# Patient Record
Sex: Female | Born: 1975 | Race: White | Hispanic: No | Marital: Married | Smoking: Never smoker
Health system: Southern US, Community
[De-identification: ages and names within clinical notes are randomized; demographics above are authoritative.]

## PROBLEM LIST (undated history)

## (undated) DIAGNOSIS — K429 Umbilical hernia without obstruction or gangrene: Secondary | ICD-10-CM

## (undated) DIAGNOSIS — F32A Depression, unspecified: Secondary | ICD-10-CM

## (undated) DIAGNOSIS — D649 Anemia, unspecified: Secondary | ICD-10-CM

## (undated) HISTORY — DX: Depression, unspecified: F32.A

## (undated) HISTORY — DX: Umbilical hernia without obstruction or gangrene: K42.9

## (undated) HISTORY — PX: AUGMENTATION MAMMAPLASTY: SUR837

## (undated) HISTORY — DX: Anemia, unspecified: D64.9

---

## 2019-02-03 LAB — COMPREHENSIVE METABOLIC PANEL
Albumin: 5 (ref 3.5–5.0)
Calcium: 9.5 (ref 8.7–10.7)
GFR calc non Af Amer: 108
Globulin: 2.4

## 2019-02-03 LAB — BASIC METABOLIC PANEL
BUN: 10 (ref 4–21)
CO2: 25 — AB (ref 13–22)
Chloride: 105 (ref 99–108)
Creatinine: 0.7 (ref 0.5–1.1)
Glucose: 94
Potassium: 4.4 (ref 3.4–5.3)
Sodium: 142 (ref 137–147)

## 2019-02-03 LAB — HEPATIC FUNCTION PANEL
ALT: 12 (ref 7–35)
AST: 17 (ref 13–35)
Alkaline Phosphatase: 39 (ref 25–125)
Bilirubin, Total: 0.6

## 2019-02-03 LAB — CBC: RBC: 4.42 (ref 3.87–5.11)

## 2019-02-03 LAB — CBC AND DIFFERENTIAL
HCT: 39 (ref 36–46)
Hemoglobin: 13.2 (ref 12.0–16.0)
Platelets: 150 (ref 150–399)
WBC: 6.8

## 2019-11-21 ENCOUNTER — Telehealth: Payer: Self-pay

## 2019-11-25 ENCOUNTER — Ambulatory Visit
Admission: RE | Admit: 2019-11-25 | Discharge: 2019-11-25 | Disposition: A | Discharge status: Home / Self Care | Payer: Self-pay | Source: Ambulatory Visit | Attending: Oncology | Admitting: Oncology

## 2019-11-25 ENCOUNTER — Other Ambulatory Visit: Payer: Self-pay | Admitting: Oncology

## 2019-11-25 ENCOUNTER — Other Ambulatory Visit: Payer: Self-pay

## 2019-11-25 ENCOUNTER — Ambulatory Visit: Payer: Self-pay | Attending: Oncology | Admitting: *Deleted

## 2019-11-25 ENCOUNTER — Encounter: Payer: Self-pay | Admitting: *Deleted

## 2019-11-25 DIAGNOSIS — Z0000 Encounter for general adult medical examination without abnormal findings: Secondary | ICD-10-CM

## 2019-11-25 DIAGNOSIS — N63 Unspecified lump in unspecified breast: Secondary | ICD-10-CM

## 2019-11-25 NOTE — Progress Notes (Addendum)
  Subjective:     Patient ID: Debra Hancock, female   DOB: [birth year], 43 y.o.   MRN: [id number]  HPI   Review of Systems     Objective:   Physical Exam     Assessment:     Due to Covid 19 pandemic a televisit was used to enroll patient into our BCCCP program.  Verbal consent given using 2 patient identifiers.  Patient has been screened for eligibility.  She does not have any insurance, Medicare or Medicaid.  She also meets financial eligibility.  Denies any breast problems at this time.  States her last pap was in 2018, in [place] was normal.  She has never had a mammogram.  Next pap due in 2021. See [facility] breast cancer risk assessment below. Risk Assessment    Risk Scores      11/25/2019   Last edited by: Rico Junker, RN   5-year risk: 1.2 %   Lifetime risk: 18.6 %             Plan:     Screening mammogram ordered.  Will follow up per BCCCP protocol.

## 2019-12-03 ENCOUNTER — Encounter: Payer: Self-pay | Admitting: *Deleted

## 2019-12-03 NOTE — Progress Notes (Signed)
Called patient today and reviewed her mammogram results and need for additional imaging.  Transferred her call to the [facility] to [facility] for scheduling.  She is to call with any further questions or needs.

## 2019-12-10 ENCOUNTER — Ambulatory Visit
Admission: RE | Admit: 2019-12-10 | Discharge: 2019-12-10 | Disposition: A | Discharge status: Home / Self Care | Payer: Self-pay | Source: Ambulatory Visit | Attending: Oncology | Admitting: Oncology

## 2019-12-10 DIAGNOSIS — N63 Unspecified lump in unspecified breast: Secondary | ICD-10-CM | POA: Insufficient documentation

## 2019-12-11 ENCOUNTER — Encounter: Payer: Self-pay | Admitting: *Deleted

## 2019-12-11 ENCOUNTER — Other Ambulatory Visit: Payer: Self-pay | Admitting: *Deleted

## 2019-12-11 DIAGNOSIS — N63 Unspecified lump in unspecified breast: Secondary | ICD-10-CM

## 2019-12-11 NOTE — Progress Notes (Signed)
Letter mailed to inform patient of her appointment on 06/10/20 @ 9:40 for her 6 month follow up mammogram.  Will continue to follow up per BCCCP protocol.

## 2020-06-09 ENCOUNTER — Ambulatory Visit: Payer: Self-pay | Attending: Oncology

## 2020-06-10 ENCOUNTER — Other Ambulatory Visit: Payer: Self-pay

## 2020-06-10 ENCOUNTER — Inpatient Hospital Stay: Admission: RE | Admit: 2020-06-10 | Payer: Self-pay | Source: Ambulatory Visit

## 2020-07-28 ENCOUNTER — Ambulatory Visit
Admission: RE | Admit: 2020-07-28 | Discharge: 2020-07-28 | Disposition: A | Discharge status: Home / Self Care | Payer: Self-pay | Source: Ambulatory Visit | Attending: Oncology | Admitting: Oncology

## 2020-07-28 ENCOUNTER — Other Ambulatory Visit: Payer: Self-pay

## 2020-07-28 DIAGNOSIS — N63 Unspecified lump in unspecified breast: Secondary | ICD-10-CM | POA: Insufficient documentation

## 2020-11-01 ENCOUNTER — Other Ambulatory Visit: Payer: Self-pay

## 2020-11-01 ENCOUNTER — Encounter: Payer: Self-pay | Admitting: Internal Medicine

## 2020-11-01 ENCOUNTER — Ambulatory Visit (INDEPENDENT_AMBULATORY_CARE_PROVIDER_SITE_OTHER): Payer: 59 | Admitting: Internal Medicine

## 2020-11-01 VITALS — BP 116/70 | HR 86 | Temp 98.6°F | Ht 65.0 in | Wt 119.0 lb

## 2020-11-01 DIAGNOSIS — N6320 Unspecified lump in the left breast, unspecified quadrant: Secondary | ICD-10-CM | POA: Insufficient documentation

## 2020-11-01 DIAGNOSIS — K429 Umbilical hernia without obstruction or gangrene: Secondary | ICD-10-CM | POA: Diagnosis not present

## 2020-11-01 DIAGNOSIS — M50121 Cervical disc disorder at C4-C5 level with radiculopathy: Secondary | ICD-10-CM | POA: Diagnosis not present

## 2020-11-01 DIAGNOSIS — F331 Major depressive disorder, recurrent, moderate: Secondary | ICD-10-CM

## 2020-11-01 DIAGNOSIS — M5126 Other intervertebral disc displacement, lumbar region: Secondary | ICD-10-CM | POA: Diagnosis not present

## 2020-11-01 MED ORDER — SERTRALINE HCL 50 MG PO TABS: 50.0000 mg | ORAL_TABLET | Freq: Every day | ORAL | 1 refills | Status: DC

## 2020-11-01 NOTE — Progress Notes (Signed)
Date:  11/01/2020   Name:  Debra Hancock   DOB:  17-Mar-1976   MRN:  [id number]   Chief Complaint: Establish Care, Back Pain (Injury May 2019 and Left hip and back has not been right since. Has had many MRI's and Xrays since. Knee cracking when pressure/ pain builds up under buttocks. ), and Depression (Has taken medications in the past and interested in going back on medications to help - tried Zoloft in the past and said it was okay.- had no side effects. )  Depression        This is a recurrent problem.  The problem has been gradually worsening since onset.  Associated symptoms include decreased concentration, helplessness, hopelessness, decreased interest, appetite change, myalgias (left buttock and upper hamstring) and sad.  Associated symptoms include no fatigue, no headaches and no suicidal ideas.     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  Past treatments include SSRIs - Selective serotonin reuptake inhibitors (on Zoloft in the past; also tried cymbalta with no benefit for depression or pain). Back Pain This is a chronic problem. The current episode started more than 1 year ago (several years ago caused by a dead lift in competition but did not stop). The problem occurs daily. The problem is unchanged (or gradually worsening). The pain is present in the lumbar spine and gluteal. The quality of the pain is described as aching and cramping. The pain radiates to the left knee. The pain is moderate. Associated symptoms include abdominal pain (umbilical hernia painful intermittently, esp when she gains weight) and tingling (in left 5th finger). Pertinent negatives include no bladder incontinence, bowel incontinence, chest pain, fever, headaches, pelvic pain or weakness. She has tried chiropractic manipulation (PTx) for the symptoms. The treatment provided no relief.  Neck Pain  This is a chronic problem. The  current episode started more than 1 year ago (several years ago no exact injury). The problem occurs daily. The problem has been gradually worsening. The pain is present in the left side. The quality of the pain is described as aching and burning. The pain is moderate. The symptoms are aggravated by bending and twisting. Associated symptoms include tingling (in left 5th finger). Pertinent negatives include no chest pain, fever, headaches or weakness. She has tried NSAIDs, chiropractic manipulation and heat for the symptoms. The treatment provided no relief.  Abnormal left mammogram - density seen on mammogram in August.  Requested a repeat Bilat Dx mammogram in 6 months.  She denies breast mass, tenderness or pain.   No results found for: CREATININE, BUN, NA, K, CL, CO2 No results found for: CHOL, HDL, LDLCALC, LDLDIRECT, TRIG, CHOLHDL No results found for: TSH No results found for: HGBA1C No results found for: WBC, HGB, HCT, MCV, PLT No results found for: ALT, AST, GGT, ALKPHOS, BILITOT   Review of Systems  Constitutional: Positive for appetite change. Negative for diaphoresis, fatigue, fever and unexpected weight change.  Respiratory: Negative for cough, chest tightness and shortness of breath.   Cardiovascular: Negative for chest pain and leg swelling.  Gastrointestinal: Positive for abdominal pain (umbilical hernia painful intermittently, esp when she gains weight). Negative for bowel incontinence, constipation, diarrhea and nausea.  Genitourinary: Negative for bladder incontinence and pelvic pain.  Musculoskeletal: Positive for back pain, myalgias (left buttock and upper hamstring) and neck pain.  Neurological: Positive for tingling (in left 5th finger). Negative for dizziness, weakness, light-headedness  and headaches.  Psychiatric/Behavioral: Positive for decreased concentration, depression and sleep disturbance. Negative for suicidal ideas. The patient is nervous/anxious.     Patient  Active Problem List   Diagnosis Date Noted  . Breast mass, left 11/01/2020    No Known Allergies  Past Surgical History:  Procedure Laterality Date  . AUGMENTATION MAMMAPLASTY Bilateral   . CESAREAN SECTION     X 2    Social History   Tobacco Use  . Smoking status: Never Smoker  . Smokeless tobacco: Never Used  Vaping Use  . Vaping Use: Never used  Substance Use Topics  . Alcohol use: Yes    Alcohol/week: 7.0 standard drinks    Types: 7 Standard drinks or equivalent per week  . Drug use: Never     Medication list has been reviewed and updated.  No outpatient medications have been marked as taking for the 11/01/20 encounter (Office Visit) with Reubin Milan, MD.    [facility] 2/9 Scores 11/01/2020  PHQ - 2 Score 6  PHQ- 9 Score 16    GAD 7 : Generalized Anxiety Score 11/01/2020  Nervous, Anxious, on Edge 2  Control/stop worrying 3  Worry too much - different things 3  Trouble relaxing 3  Restless 2  Easily annoyed or irritable 2  Afraid - awful might happen 0  Total GAD 7 Score 15  Anxiety Difficulty Somewhat difficult    BP Readings from Last 3 Encounters:  11/01/20 116/70    Physical Exam Vitals and nursing note reviewed.  Constitutional:      General: She is not in acute distress.    Appearance: Normal appearance. She is well-developed.  HENT:     Head: Normocephalic and atraumatic.  Cardiovascular:     Rate and Rhythm: Normal rate and regular rhythm.     Pulses: Normal pulses.     Heart sounds: No murmur heard.   Pulmonary:     Effort: Pulmonary effort is normal. No respiratory distress.     Breath sounds: No wheezing or rhonchi.  Abdominal:     General: Abdomen is flat.     Palpations: Abdomen is soft.     Tenderness: There is abdominal tenderness (at umbilical hernia).     Hernia: A hernia (reducible, mildly tender) is present.  Musculoskeletal:     Cervical back: Spasms and tenderness present. Decreased range of motion.     Lumbar back:  Bony tenderness present. No tenderness. Normal range of motion. Negative right straight leg raise test and negative left straight leg raise test. No scoliosis.     Right lower leg: No edema.     Left lower leg: No edema.  Skin:    General: Skin is warm and dry.     Capillary Refill: Capillary refill takes less than 2 seconds.     Findings: No rash.  Neurological:     General: No focal deficit present.     Mental Status: She is alert and oriented to person, place, and time.     Cranial Nerves: Cranial nerves are intact.     Sensory: Sensation is intact.     Motor: Motor function is intact. No weakness.     Coordination: Coordination is intact.     Gait: Gait is intact. Gait normal.     Deep Tendon Reflexes:     Reflex Scores:      Bicep reflexes are 2+ on the right side and 2+ on the left side.      Patellar  reflexes are 3+ on the right side and 3+ on the left side. Psychiatric:        Mood and Affect: Mood normal.     Wt Readings from Last 3 Encounters:  11/01/20 119 lb (54 kg)    BP 116/70   Pulse 86   Temp 98.6 F (37 C) (Oral)   Ht 5\' 5"  (1.651 m)   Wt 119 lb (54 kg)   LMP 10/25/2020 (Exact Date)   SpO2 97%   BMI 19.80 kg/m   Assessment and Plan: 1. Moderate episode of recurrent major depressive disorder (HCC) Begin Zoloft 50 mg daily Follow up in 6 weeks for dose titration if needed Call if side effects or other medication issues - sertraline (ZOLOFT) 50 MG tablet; Take 1 tablet (50 mg total) by mouth daily.  Dispense: 30 tablet; Refill: 1  2. Umbilical hernia without obstruction and without gangrene Pt is reassured - this may need to be addressed with surgery but for now we can continue to monitor.  3. HNP (herniated nucleus pulposus), lumbar Per patient report she has had several MRIs and xrays Did not improve with PTx or Chiropractic manipulation The only medication she has tried is Cymbalta and Advil - no muscle relaxants, gabapentin or ESI treatment Will  wait for prior imaging and the discuss options  4. Cervical disc disorder at C4-C5 level with radiculopathy Has tingling in her left 5th finger intermittently Would probably benefit from muscle relaxants but pt is hesitant to take medication daily Will discuss again at follow up visit.   Partially dictated using 10/27/2020. Any errors are unintentional.  Animal nutritionist, MD [facility]  11/01/2020

## 2020-12-15 ENCOUNTER — Ambulatory Visit: Payer: 59 | Admitting: Internal Medicine

## 2021-01-03 ENCOUNTER — Other Ambulatory Visit: Payer: Self-pay

## 2021-01-03 ENCOUNTER — Encounter: Payer: Self-pay | Admitting: Internal Medicine

## 2021-01-03 ENCOUNTER — Ambulatory Visit (INDEPENDENT_AMBULATORY_CARE_PROVIDER_SITE_OTHER): Payer: 59 | Admitting: Internal Medicine

## 2021-01-03 VITALS — BP 108/62 | HR 93 | Temp 97.9°F | Ht 65.0 in | Wt 125.0 lb

## 2021-01-03 DIAGNOSIS — F331 Major depressive disorder, recurrent, moderate: Secondary | ICD-10-CM | POA: Diagnosis not present

## 2021-01-03 DIAGNOSIS — N6320 Unspecified lump in the left breast, unspecified quadrant: Secondary | ICD-10-CM

## 2021-01-03 DIAGNOSIS — M50121 Cervical disc disorder at C4-C5 level with radiculopathy: Secondary | ICD-10-CM

## 2021-01-03 DIAGNOSIS — M5126 Other intervertebral disc displacement, lumbar region: Secondary | ICD-10-CM | POA: Diagnosis not present

## 2021-01-03 MED ORDER — SERTRALINE HCL 50 MG PO TABS: 50.0000 mg | ORAL_TABLET | Freq: Every day | ORAL | 1 refills | Status: AC

## 2021-01-03 NOTE — Progress Notes (Signed)
Date:  01/03/2021   Name:  Debra Hancock   DOB:  [birth year]   MRN:  [id number]   Chief Complaint: Depression  Depression        This is a recurrent problem.  The current episode started more than 1 month ago.   The problem has been gradually improving since onset.  Past treatments include SSRIs - Selective serotonin reuptake inhibitors. Neck Pain  This is a chronic problem. The pain is associated with lifting a heavy object. The pain is present in the left side. The quality of the pain is described as aching. The pain is mild. Pertinent negatives include no weakness. Tingling: in left fingers. She has tried NSAIDs for the symptoms. The treatment provided no relief.  Back Pain This is a chronic problem. The pain is present in the lumbar spine. The quality of the pain is described as aching. The pain radiates to the left thigh. Pertinent negatives include no bladder incontinence, bowel incontinence, perianal numbness or weakness. Tingling: in left fingers. She has tried NSAIDs for the symptoms.   Abnormal mammogram - mammo in August recommended bilateral Dx in 5 months.  She has been doing it thru the cancer center at [facility].  Last MRI Cervical spine 04/16/07 - MRI 2007/04/16 - minimal disc bulge at C4-5, C5-6 and C6--7 No foraminal stenosis  Last MRI Lumbar 04/16/19 -  Started during a dead lift competition around 04/15/18 in [place] MRI Apr 16, 2019 - very mild disc bulge at L4-5 and L5-S1 with minimal foraminal stenosis   Lab Results  Component Value Date   CREATININE 0.7 02/03/2019   BUN 10 02/03/2019   NA 142 02/03/2019   K 4.4 02/03/2019   CL 105 02/03/2019   CO2 25 (A) 02/03/2019   No results found for: CHOL, HDL, LDLCALC, LDLDIRECT, TRIG, CHOLHDL No results found for: TSH No results found for: HGBA1C Lab Results  Component Value Date   WBC 6.8 02/03/2019   HGB 13.2 02/03/2019   HCT 39 02/03/2019   PLT 150 02/03/2019   Lab Results  Component Value Date   ALT 12 02/03/2019   AST 17 02/03/2019    ALKPHOS 39 02/03/2019     Review of Systems  Gastrointestinal: Negative for bowel incontinence.  Genitourinary: Negative for bladder incontinence.  Musculoskeletal: Positive for back pain and neck pain.  Neurological: Negative for weakness. Tingling: in left fingers.  Psychiatric/Behavioral: Positive for depression.    Patient Active Problem List   Diagnosis Date Noted  . Breast mass, left 11/01/2020  . Moderate episode of recurrent major depressive disorder (HCC) 11/01/2020  . HNP (herniated nucleus pulposus), lumbar 11/01/2020  . Cervical disc disorder at C4-C5 level with radiculopathy 11/01/2020  . Umbilical hernia without obstruction and without gangrene 11/01/2020    No Known Allergies  Past Surgical History:  Procedure Laterality Date  . AUGMENTATION MAMMAPLASTY Bilateral   . CESAREAN SECTION     X 2    Social History   Tobacco Use  . Smoking status: Never Smoker  . Smokeless tobacco: Never Used  Vaping Use  . Vaping Use: Never used  Substance Use Topics  . Alcohol use: Yes    Alcohol/week: 7.0 standard drinks    Types: 7 Standard drinks or equivalent per week  . Drug use: Never     Medication list has been reviewed and updated.  Current Meds  Medication Sig  . sertraline (ZOLOFT) 50 MG tablet Take 1 tablet (50 mg total) by mouth daily.  PHQ 2/9 Scores 01/03/2021 11/01/2020  PHQ - 2 Score 0 6  PHQ- 9 Score 0 16    GAD 7 : Generalized Anxiety Score 01/03/2021 11/01/2020  Nervous, Anxious, on Edge 0 2  Control/stop worrying 0 3  Worry too much - different things 0 3  Trouble relaxing 0 3  Restless 0 2  Easily annoyed or irritable 0 2  Afraid - awful might happen 0 0  Total GAD 7 Score 0 15  Anxiety Difficulty - Somewhat difficult    BP Readings from Last 3 Encounters:  01/03/21 108/62  11/01/20 116/70    Physical Exam Vitals and nursing note reviewed.  Constitutional:      General: She is not in acute distress.    Appearance: Normal  appearance. She is well-developed.  HENT:     Head: Normocephalic and atraumatic.  Neck:     Vascular: No carotid bruit.  Cardiovascular:     Rate and Rhythm: Normal rate and regular rhythm.     Pulses: Normal pulses.     Heart sounds: No murmur heard.   Pulmonary:     Effort: Pulmonary effort is normal. No respiratory distress.     Breath sounds: No wheezing or rhonchi.  Musculoskeletal:     Cervical back: Normal range of motion. No spasms, tenderness or bony tenderness. Normal range of motion.     Lumbar back: Bony tenderness present. Negative right straight leg raise test and negative left straight leg raise test.     Right lower leg: No edema.     Left lower leg: No edema.  Lymphadenopathy:     Cervical: No cervical adenopathy.  Skin:    General: Skin is warm and dry.     Findings: No rash.  Neurological:     Mental Status: She is alert and oriented to person, place, and time.     Sensory: Sensation is intact.     Motor: No weakness, tremor or atrophy.     Deep Tendon Reflexes:     Reflex Scores:      Bicep reflexes are 2+ on the right side and 2+ on the left side.      Brachioradialis reflexes are 2+ on the right side and 2+ on the left side.      Patellar reflexes are 2+ on the right side and 2+ on the left side. Psychiatric:        Mood and Affect: Mood and affect normal.        Behavior: Behavior normal.        Thought Content: Thought content normal.     Wt Readings from Last 3 Encounters:  01/03/21 125 lb (56.7 kg)  11/01/20 119 lb (54 kg)    BP 108/62   Pulse 93   Temp 97.9 F (36.6 C) (Oral)   Ht 5\' 5"  (1.651 m)   Wt 125 lb (56.7 kg)   LMP 12/19/2020   SpO2 99%   BMI 20.80 kg/m   Assessment and Plan: 1. Moderate episode of recurrent major depressive disorder (HCC) Clinically stable on current regimen with good control of symptoms, No SI or HI. Will continue current therapy. - sertraline (ZOLOFT) 50 MG tablet; Take 1 tablet (50 mg total) by mouth  daily.  Dispense: 90 tablet; Refill: 1  2. Breast mass, left Due for 6 month follow up - will call 12/21/2020 at [place] to schedule  3. Cervical disc disorder at C4-C5 level with radiculopathy Last MRI minimal findings in 2008 -  now with more persistent left sided sx - Ambulatory referral to Orthopedic Surgery  4. HNP (herniated nucleus pulposus), lumbar Last MRI 2020 with minimal disc bulge and foraminal stenosis - Ambulatory referral to Orthopedic Surgery   Partially dictated using Dragon software. Any errors are unintentional.  Bari Edward, MD [facility]  01/03/2021

## 2021-07-20 ENCOUNTER — Encounter: Payer: 59 | Admitting: Internal Medicine

## 2021-11-01 ENCOUNTER — Other Ambulatory Visit: Payer: Self-pay | Admitting: Internal Medicine

## 2021-11-01 DIAGNOSIS — F331 Major depressive disorder, recurrent, moderate: Secondary | ICD-10-CM

## 2021-11-02 NOTE — Telephone Encounter (Signed)
Requested medications are due for refill today.  yes  Requested medications are on the active medications list.  yes  Last refill. 01/03/2021  Future visit scheduled.   no  Notes to clinic.  Pt is more than 3 months overdue for an office visit.

## 2021-11-18 IMAGING — MG MM DIGITAL DIAGNOSTIC UNILAT*R* W/ TOMO W/ CAD
6 series · 6 of 14 positions shown · non-contrast
Comparison: 12/10/2019

CLINICAL DATA: First six-month follow-up for probably benign mass
in the LEFT breast.

EXAM:
DIGITAL DIAGNOSTIC LEFT MAMMOGRAM WITH IMPLANTS AND CAD
The patient has retropectoral saline implant. Standard and implant
displaced views were performed.

[L CC]
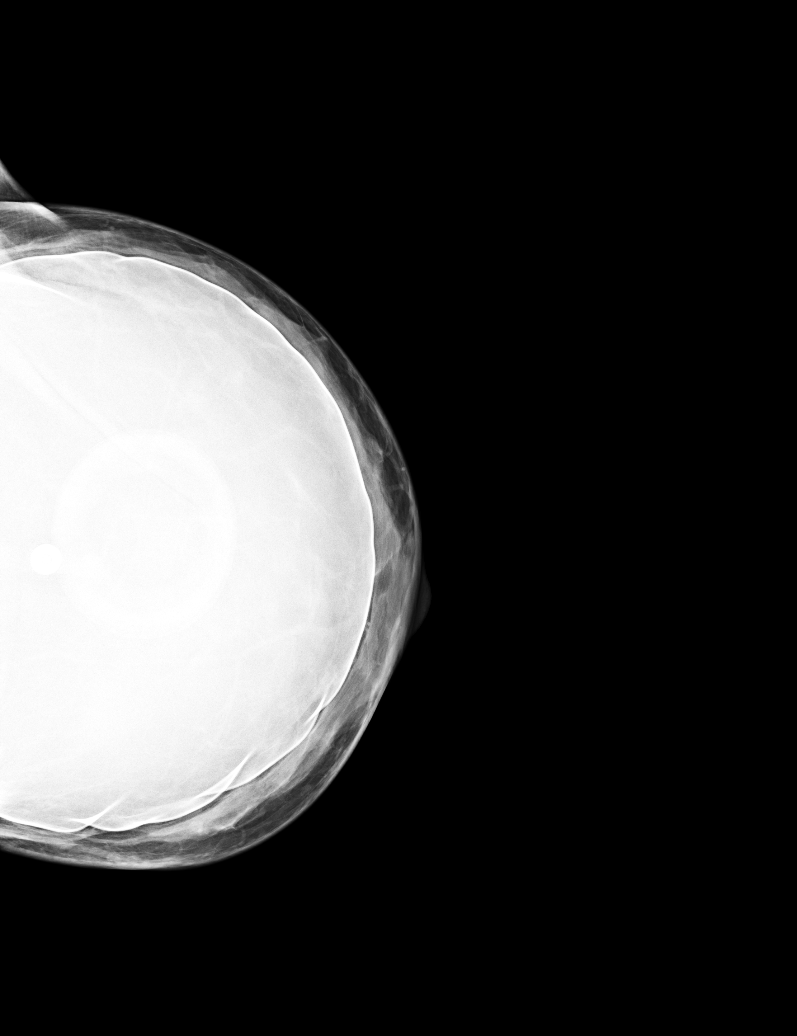

[L MLO]
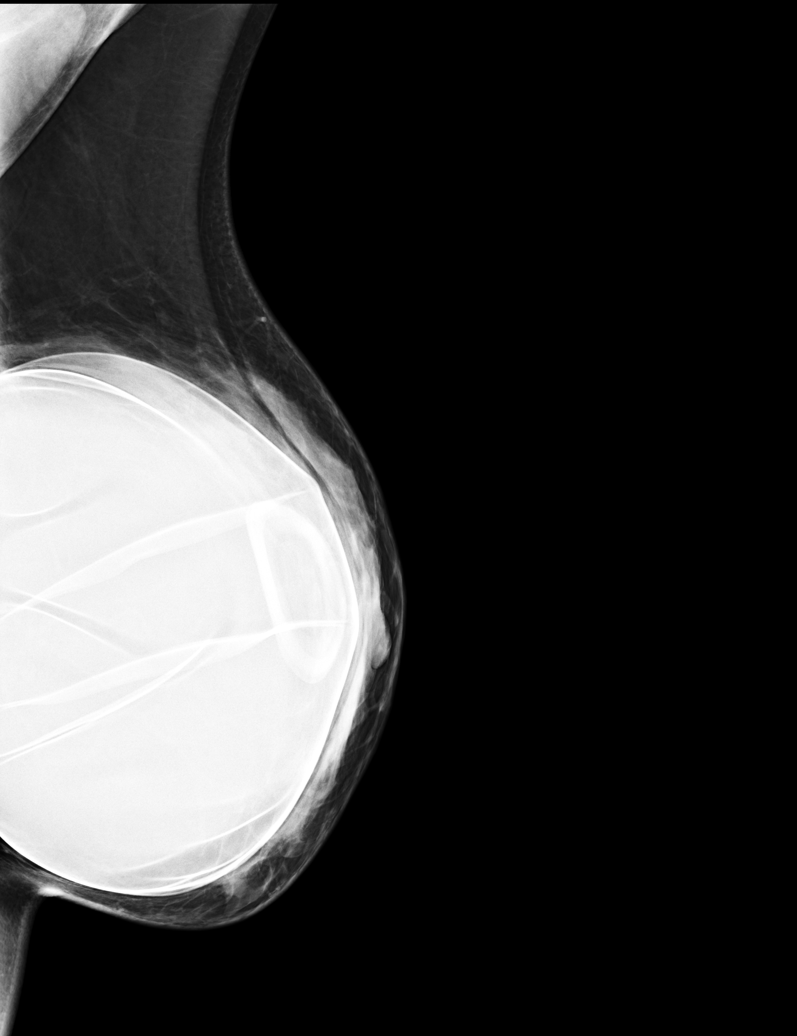

[L CC synth-2D]
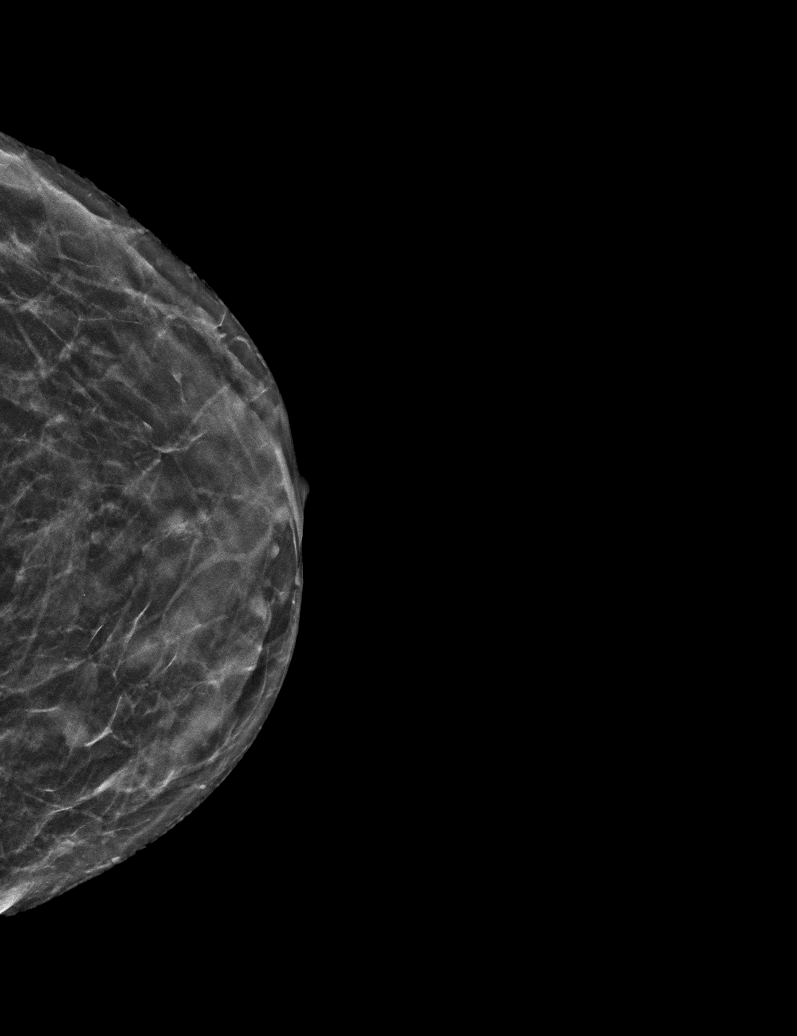

[L MLO synth-2D]
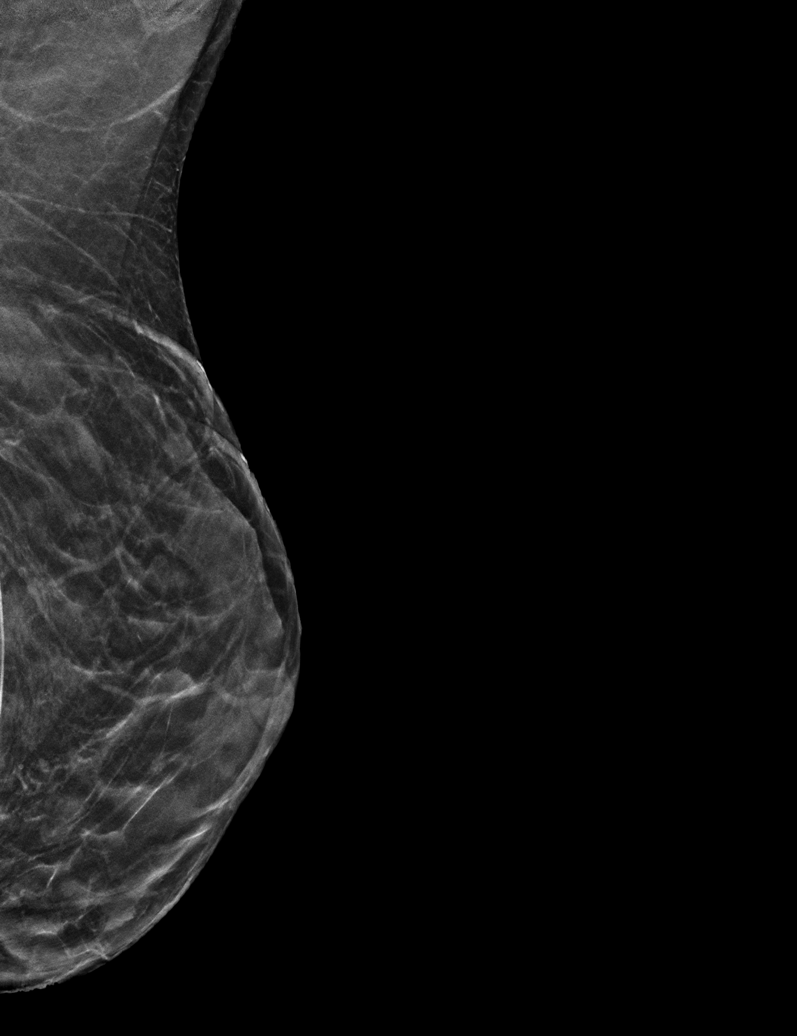

[L CCID BREAST TOMOSYNTHESIS IMAGE tomo · tomo slice 20/39.0]
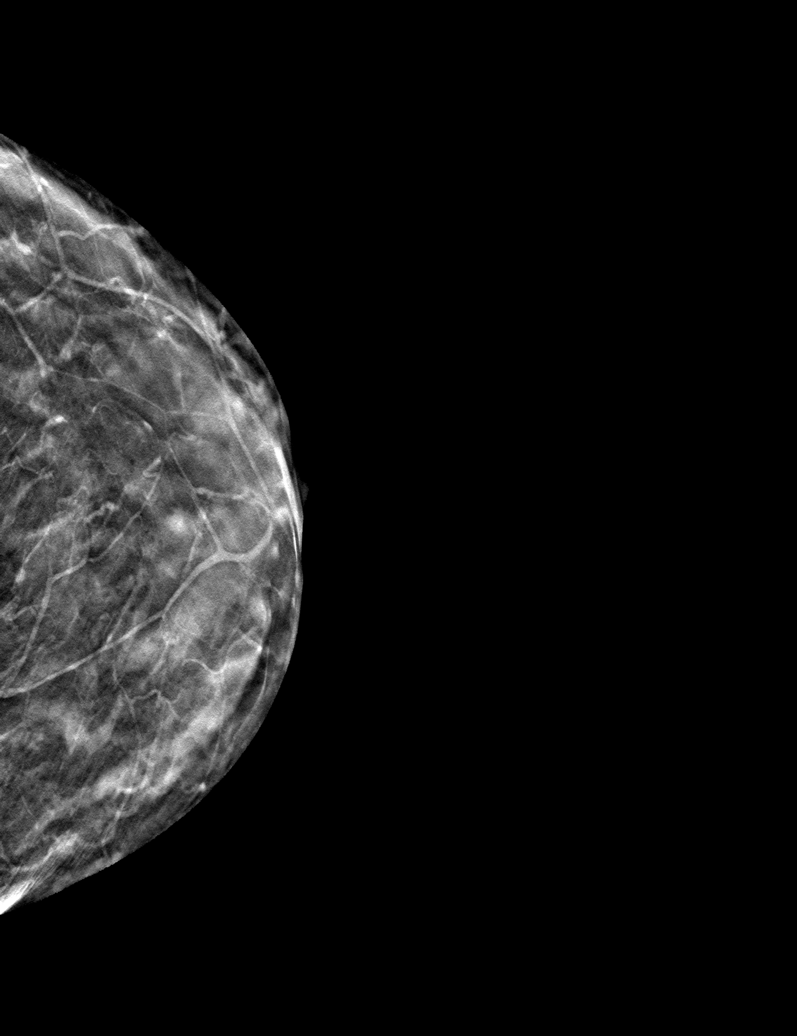

[L MLOID BREAST TOMOSYNTHESIS IMAGE tomo · tomo slice 17/34.0]
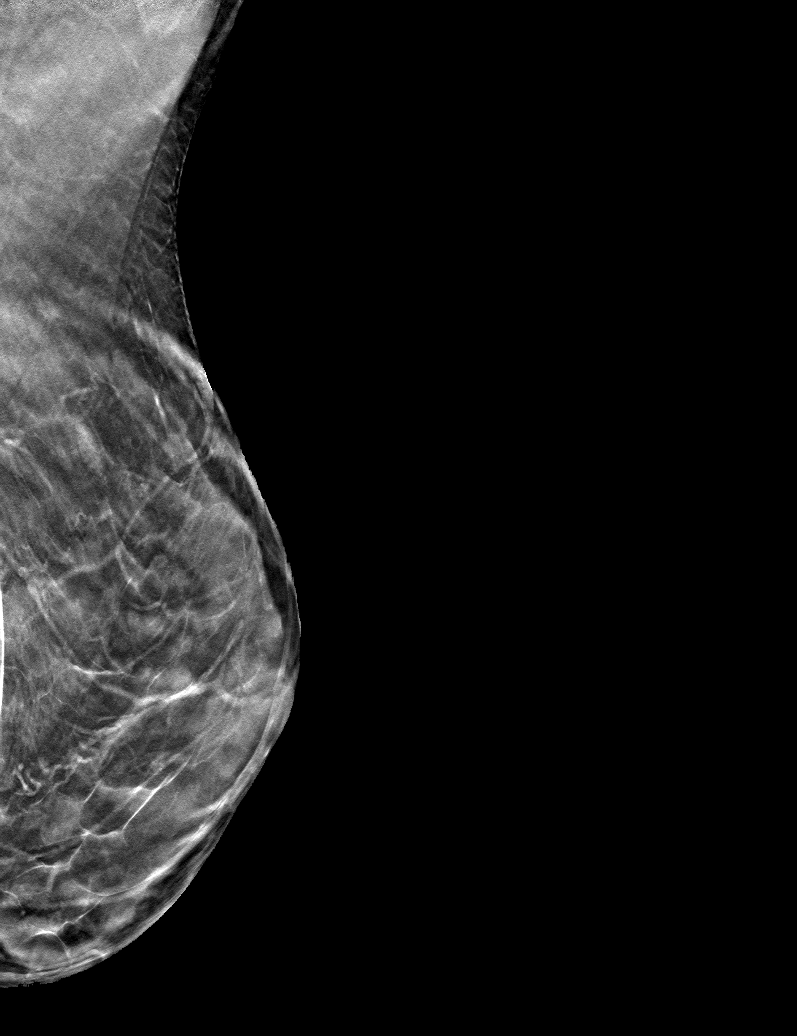

[6 of 14 positions shown; findings below may reference images not displayed]

ACR Breast Density Category c: The breast tissue is heterogeneously
dense, which may obscure small masses.
FINDINGS: Within the LOWER central portion of the LEFT breast there is a
persistent 3 millimeter circumscribed mass with possible lucent
center. Appearance is stable compared to prior study. No new or
suspicious findings.

Mammographic images were processed with CAD.
IMPRESSION: Stable appearance of probably benign LEFT breast mass.

RECOMMENDATION:
Recommend bilateral diagnostic mammogram in 6 months.

I have discussed the findings and recommendations with the patient.
If applicable, a reminder letter will be sent to the patient
regarding the next appointment.

BI-RADS CATEGORY  3: Probably benign.
